# Patient Record
Sex: Female | Born: 1970 | Race: White | Hispanic: No | Marital: Married | Smoking: Current every day smoker
Health system: Southern US, Community
[De-identification: ages and names within clinical notes are randomized; demographics above are authoritative.]

---

## 2000-02-06 ENCOUNTER — Other Ambulatory Visit: Admission: RE | Admit: 2000-02-06 | Discharge: 2000-02-06 | Payer: Self-pay | Admitting: General Practice

## 2009-07-21 ENCOUNTER — Ambulatory Visit (HOSPITAL_COMMUNITY): Admission: RE | Admit: 2009-07-21 | Discharge: 2009-07-21 | Payer: Self-pay | Admitting: Obstetrics and Gynecology

## 2009-08-18 ENCOUNTER — Ambulatory Visit (HOSPITAL_COMMUNITY): Admission: RE | Admit: 2009-08-18 | Discharge: 2009-08-18 | Payer: Self-pay | Admitting: Obstetrics and Gynecology

## 2009-09-01 ENCOUNTER — Ambulatory Visit (HOSPITAL_COMMUNITY): Admission: RE | Admit: 2009-09-01 | Discharge: 2009-09-01 | Payer: Self-pay | Admitting: Obstetrics and Gynecology

## 2009-10-18 ENCOUNTER — Ambulatory Visit (HOSPITAL_COMMUNITY): Admission: RE | Admit: 2009-10-18 | Discharge: 2009-10-18 | Payer: Self-pay | Admitting: Obstetrics and Gynecology

## 2009-11-22 ENCOUNTER — Ambulatory Visit (HOSPITAL_COMMUNITY): Admission: RE | Admit: 2009-11-22 | Discharge: 2009-11-22 | Payer: Self-pay | Admitting: Obstetrics and Gynecology

## 2009-12-27 ENCOUNTER — Ambulatory Visit (HOSPITAL_COMMUNITY): Admission: RE | Admit: 2009-12-27 | Discharge: 2009-12-27 | Payer: Self-pay | Admitting: Obstetrics and Gynecology

## 2010-09-04 ENCOUNTER — Encounter: Payer: Self-pay | Admitting: Obstetrics and Gynecology

## 2011-10-10 IMAGING — US US OB NUCHAL TRANSLUCENCY 1ST GEST
1 series · 14 of 28 positions shown · non-contrast
Comparison: none

OBSTETRICAL ULTRASOUND:
 This ultrasound was performed in The [HOSPITAL], and the [facility] report will be stored to [REDACTED] PACS.  This report is also available in [HOSPITAL]?s accessANYware.

[Series 1: us ob nuchal translucency 1st gest · 14 of 31 slices shown]
[im 2/31]
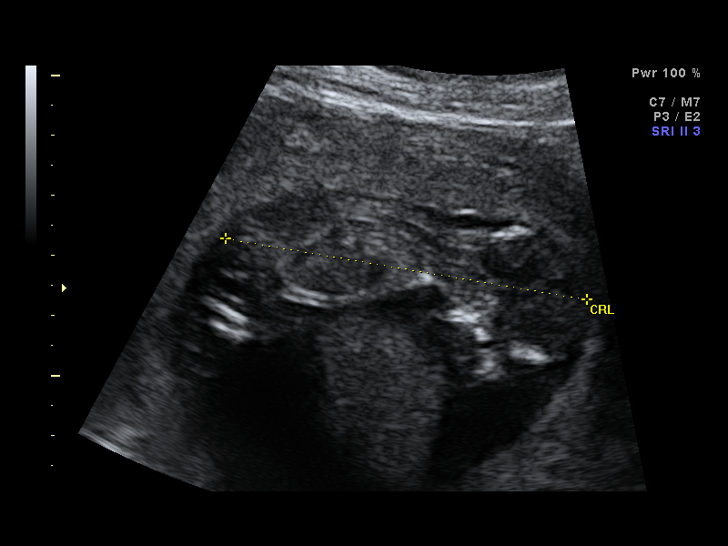
[im 4/31]
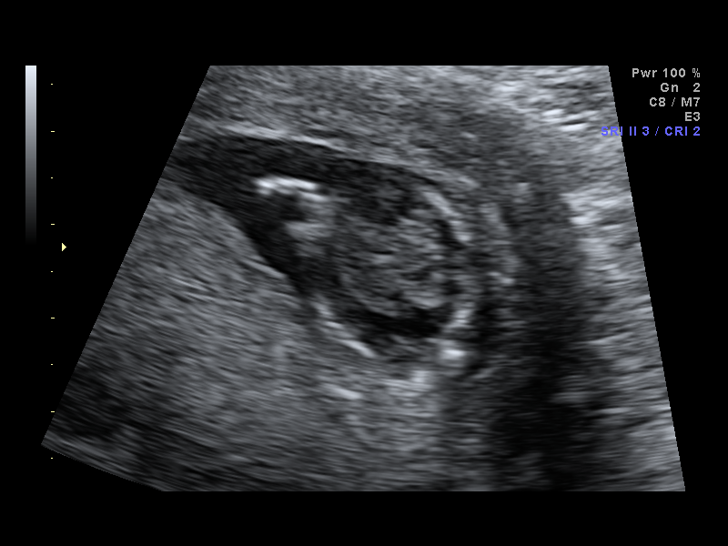
[im 6/31]
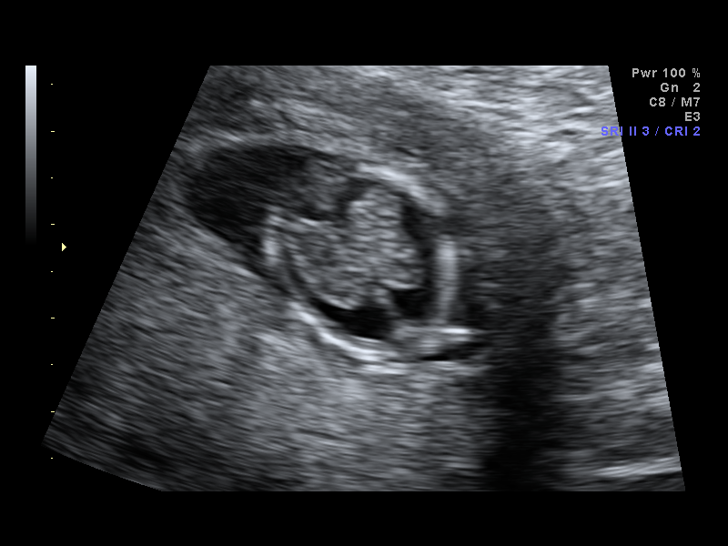
[im 8/31]
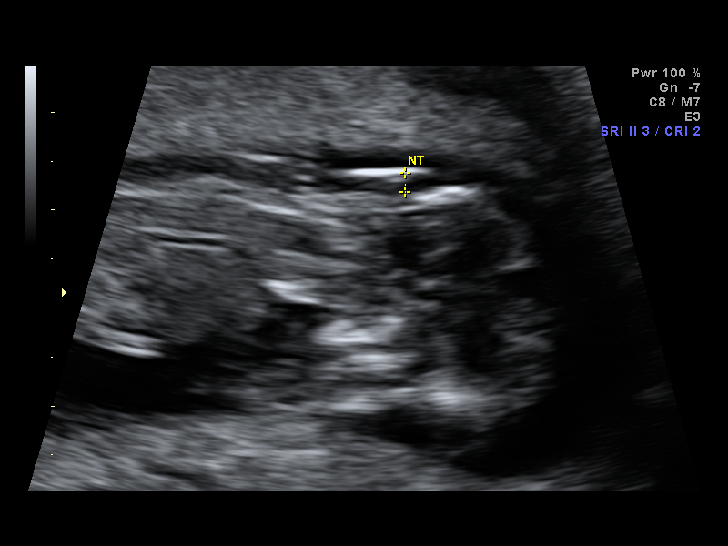
[im 11/31]
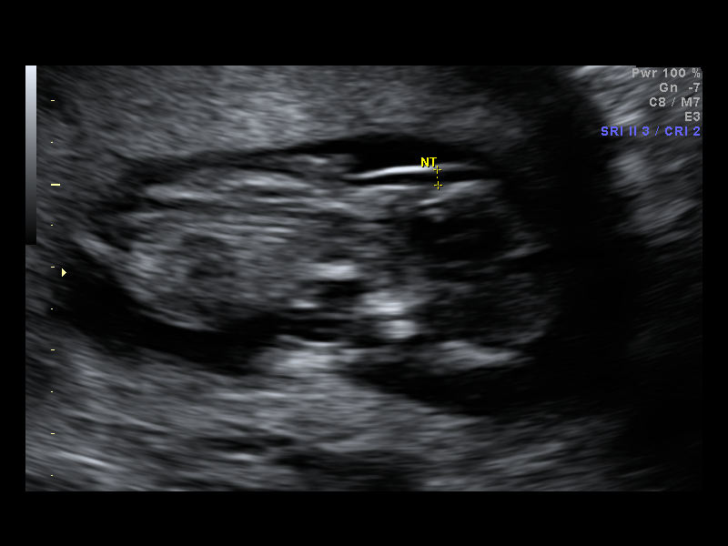
[im 13/31]
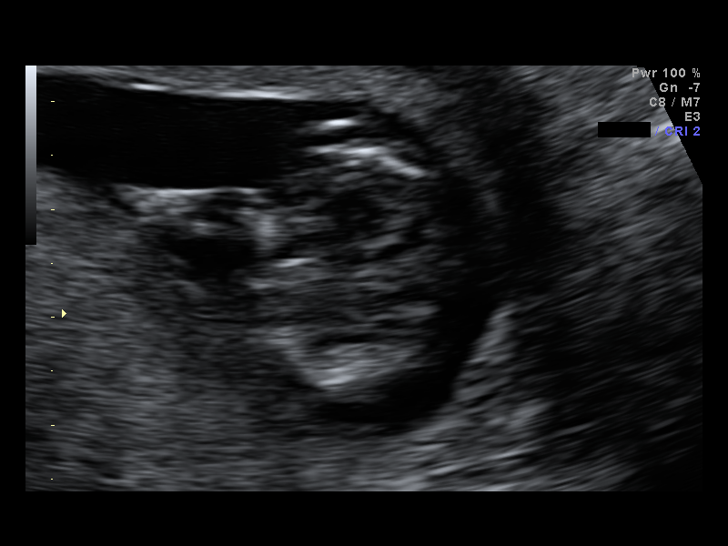
[im 15/31]
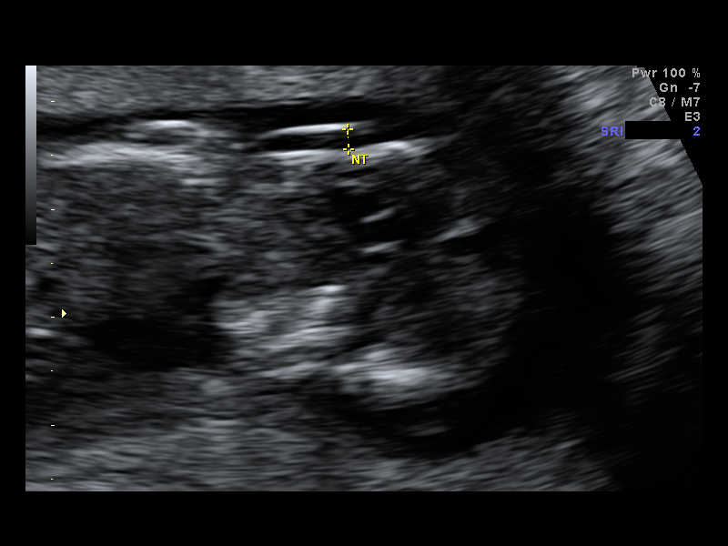
[im 17/31]
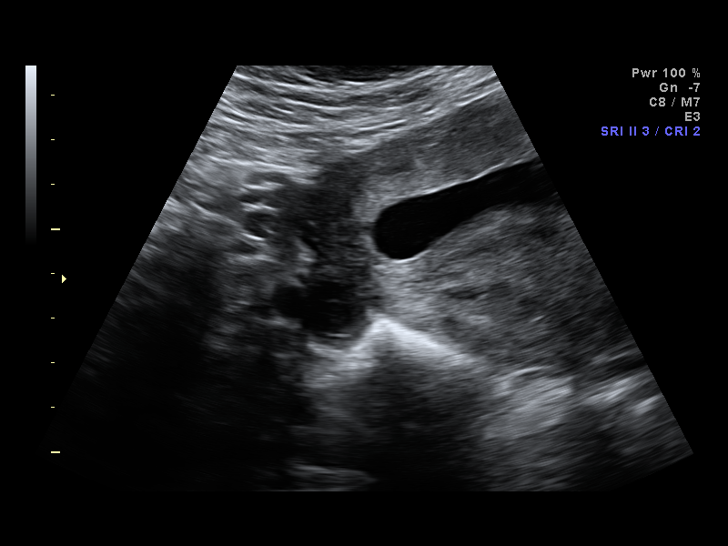
[im 19/31]
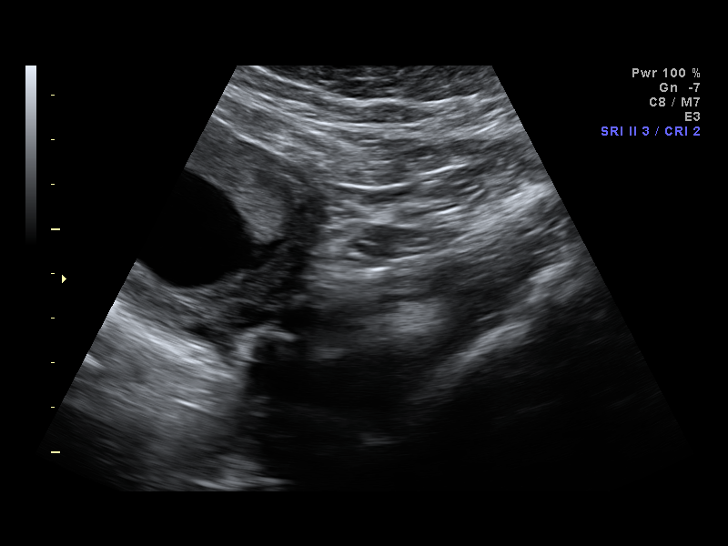
[im 22/31]
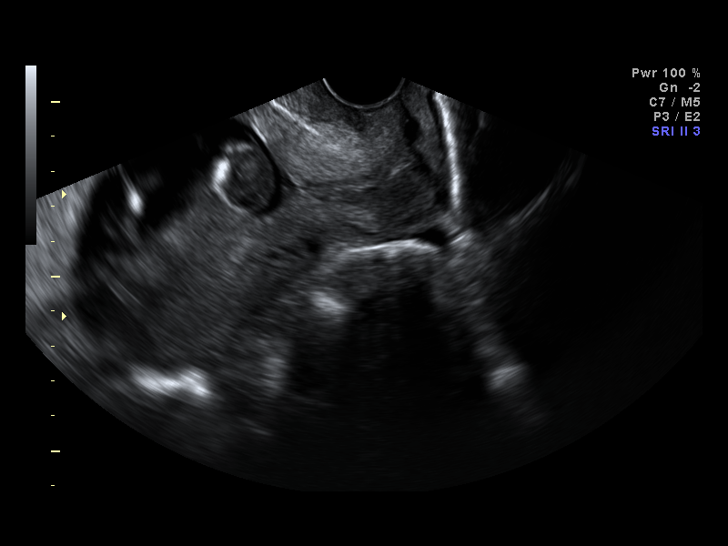
[im 24/31]
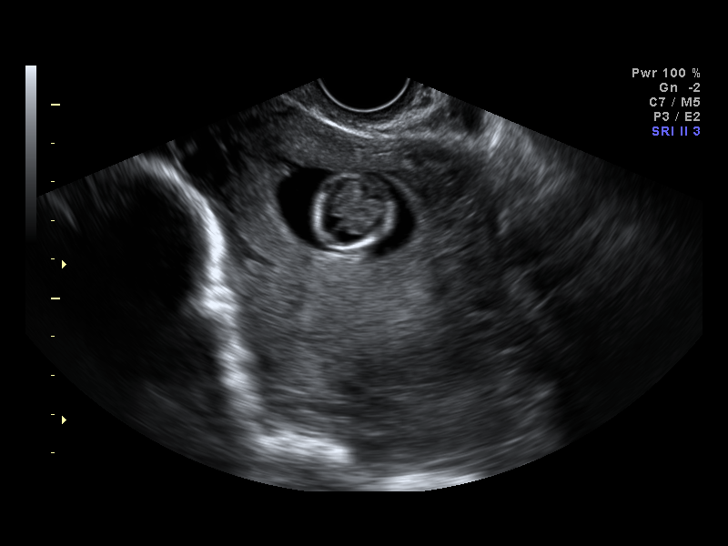
[im 26/31]
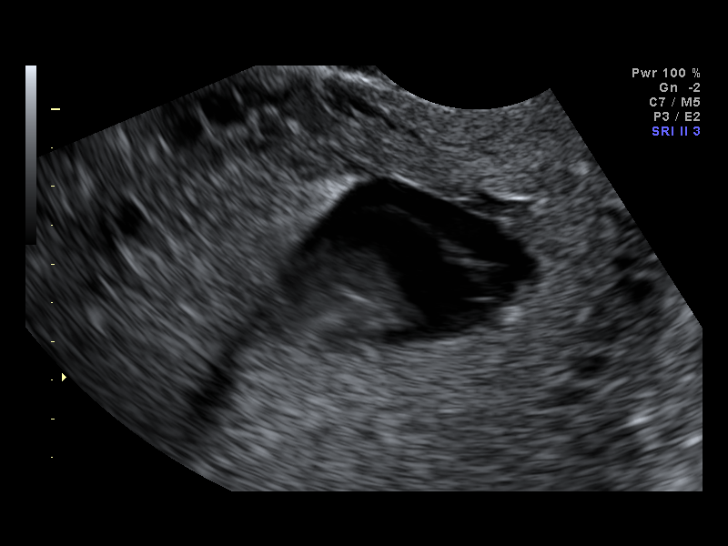
[im 28/31]
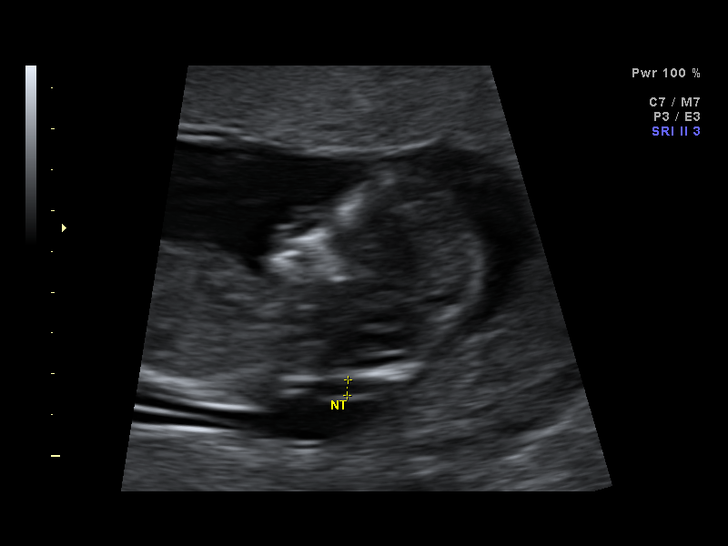
[im 31/31]
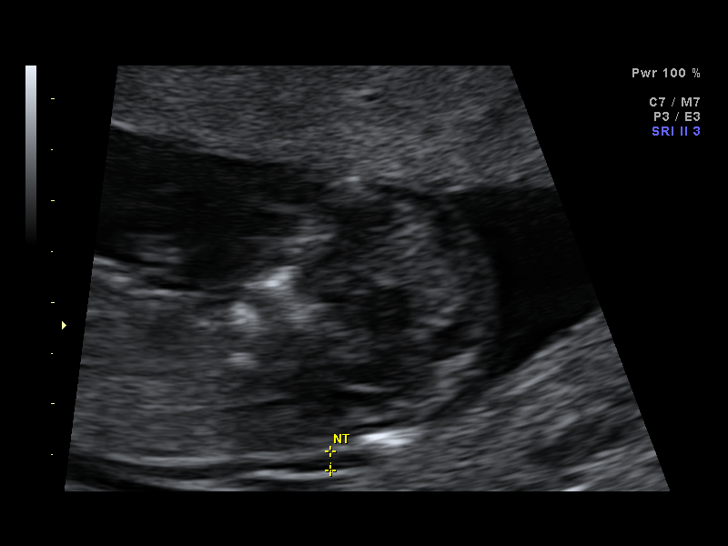

[14 of 28 positions shown; findings below may reference images not displayed]

IMPRESSION: [facility] has also been faxed to the ordering physician.

## 2012-02-11 IMAGING — US US OB FOLLOW-UP
1 series · 14 of 28 positions shown · non-contrast
Comparison: none

OBSTETRICAL ULTRASOUND:
 This ultrasound was performed in The [[facility]], and the [facility] report will be stored to [REDACTED] PACS.  This report is also available in [HOSPITAL]?s accessANYware.

[Series 1: us ob follow-up · 14 of 43 slices shown]
[im 2/43]
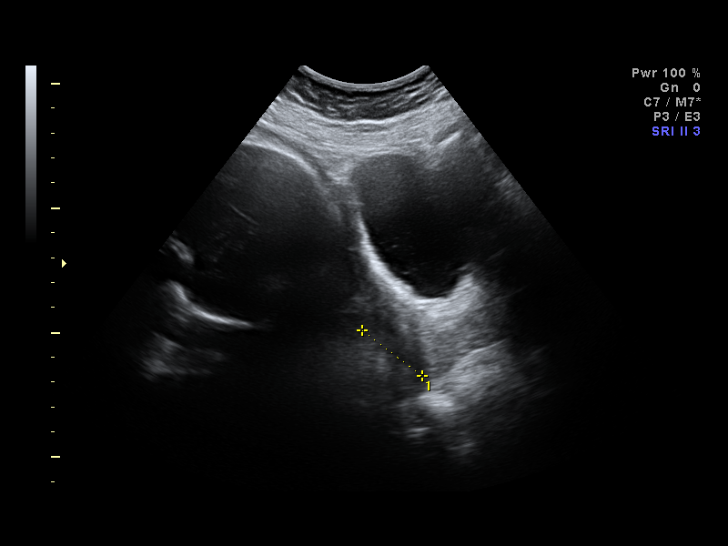
[im 5/43]
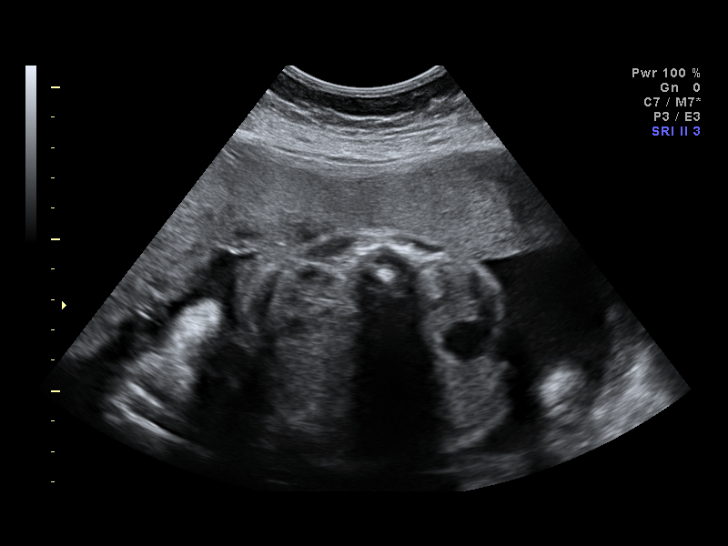
[im 8/43]
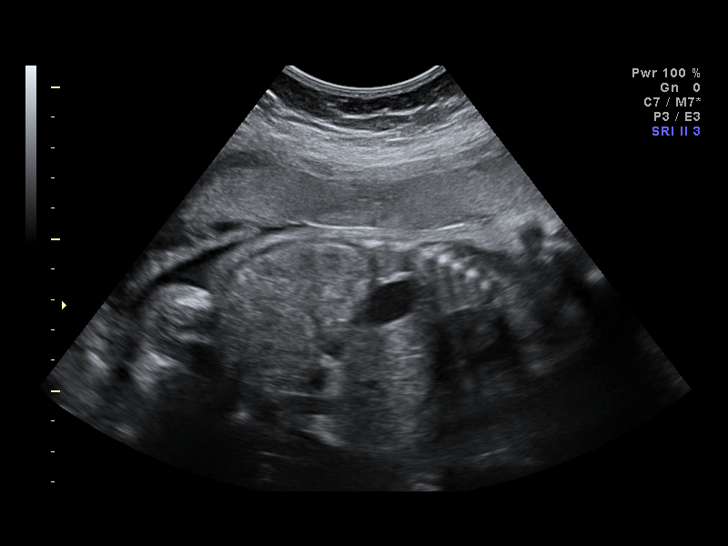
[im 11/43]
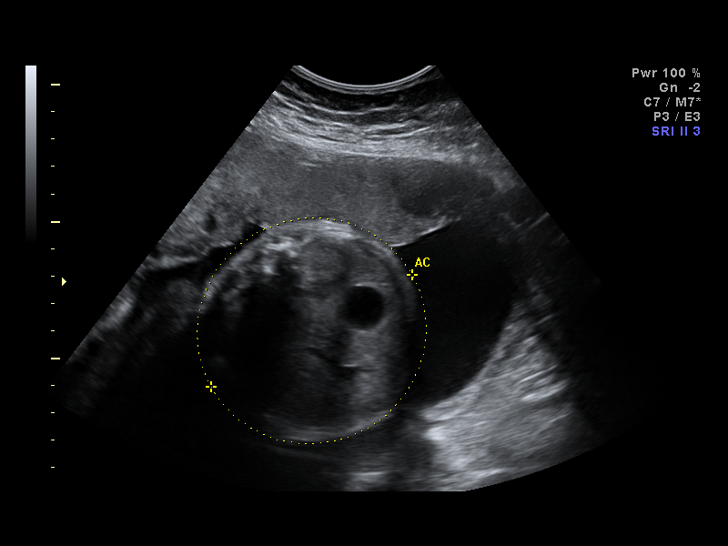
[im 15/43]
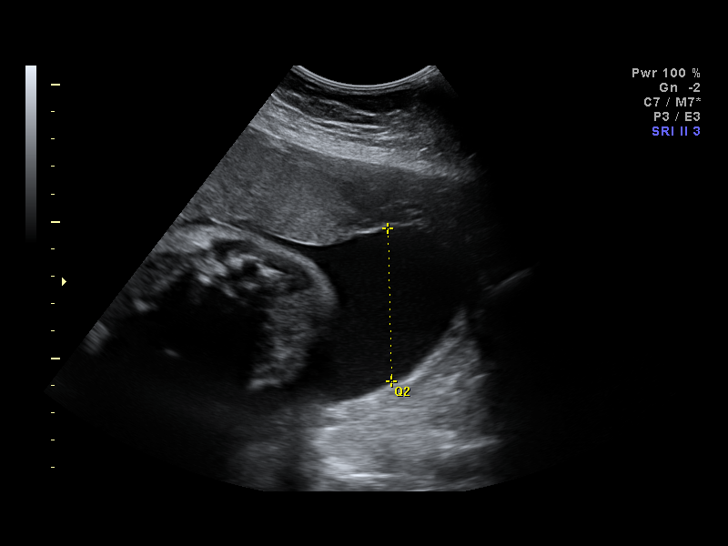
[im 18/43]
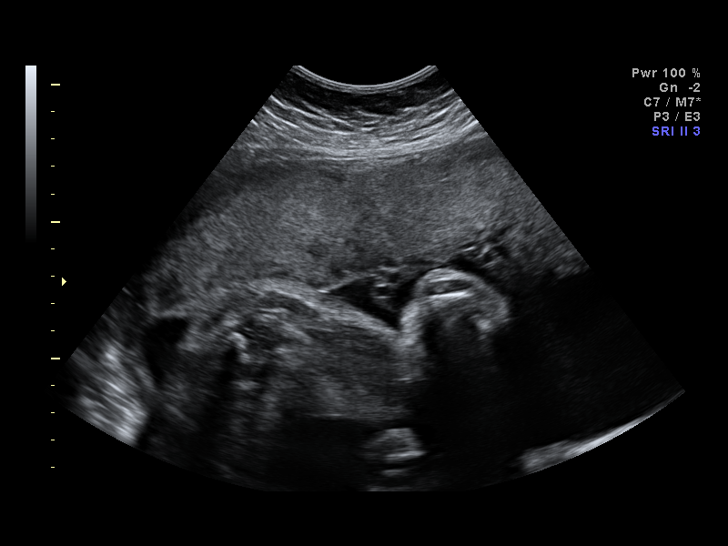
[im 21/43]
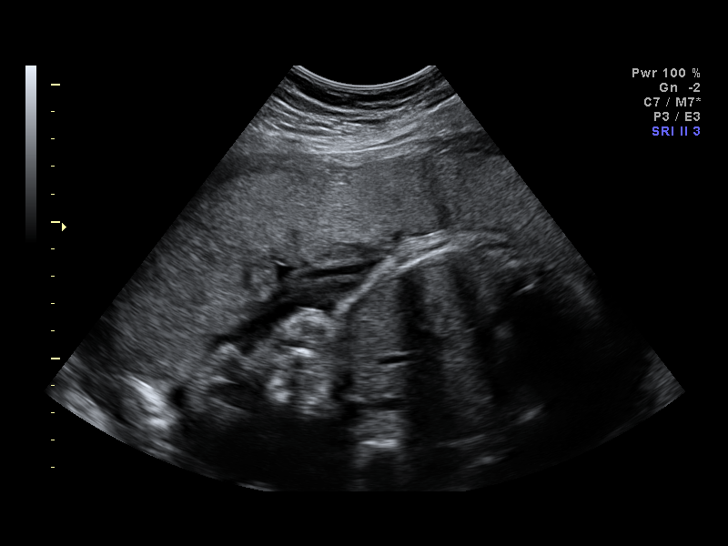
[im 24/43]
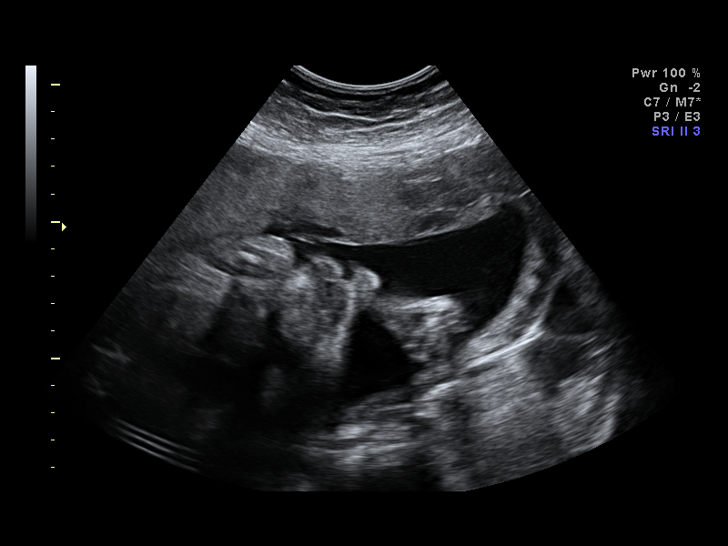
[im 27/43]
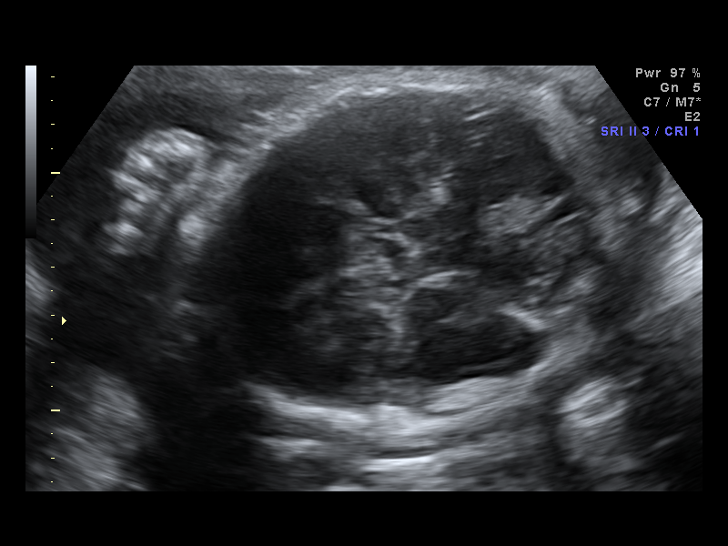
[im 30/43]
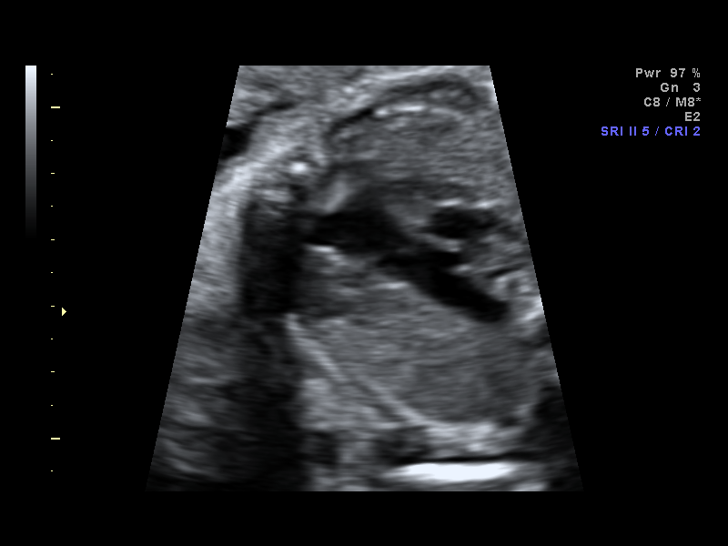
[im 33/43]
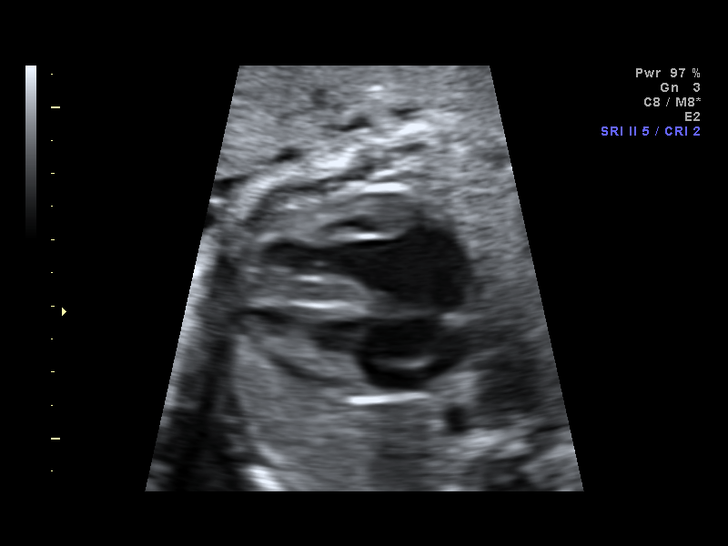
[im 36/43]
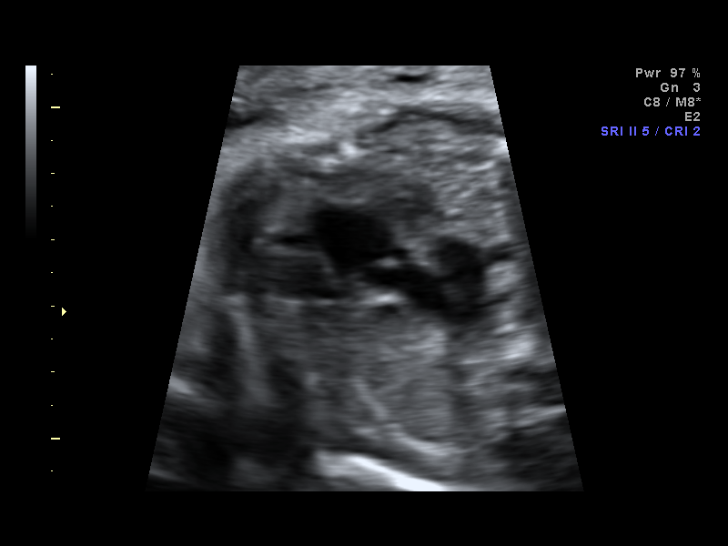
[im 39/43]
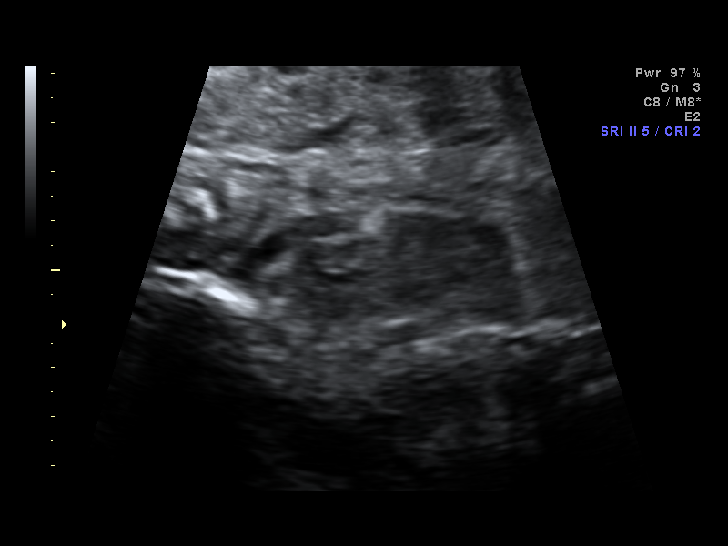
[im 43/43]
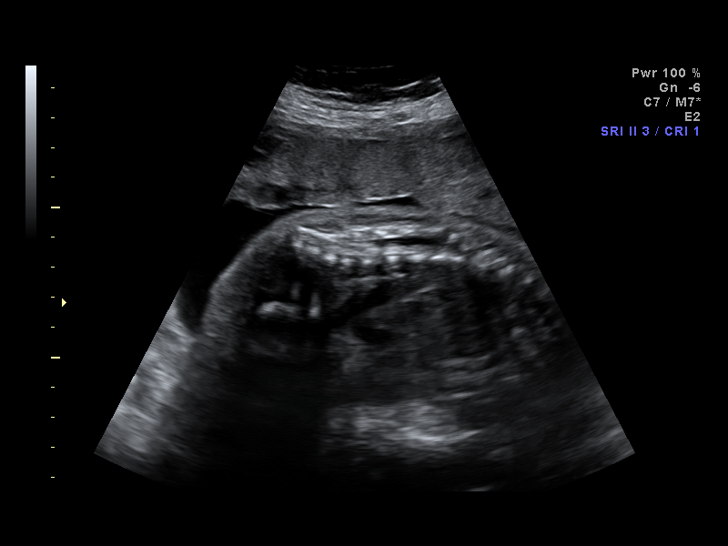

[14 of 28 positions shown; findings below may reference images not displayed]

IMPRESSION: [facility] has also been faxed to the ordering physician.

## 2014-12-11 ENCOUNTER — Encounter: Payer: Self-pay | Admitting: Podiatry

## 2014-12-22 ENCOUNTER — Ambulatory Visit (INDEPENDENT_AMBULATORY_CARE_PROVIDER_SITE_OTHER): Payer: BLUE CROSS/BLUE SHIELD | Admitting: Podiatry

## 2014-12-22 VITALS — BP 108/72 | HR 101 | Resp 15

## 2014-12-22 DIAGNOSIS — L600 Ingrowing nail: Secondary | ICD-10-CM | POA: Diagnosis not present

## 2014-12-22 DIAGNOSIS — M79674 Pain in right toe(s): Secondary | ICD-10-CM

## 2014-12-22 MED ORDER — HYDROCODONE-ACETAMINOPHEN 10-325 MG PO TABS: 1.0000 | ORAL_TABLET | Freq: Three times a day (TID) | ORAL | Status: AC | PRN

## 2014-12-22 NOTE — Progress Notes (Signed)
Subjective:     Patient ID: Megan Bishop, female   DOB: 1971/07/25, 44 y.o.   MRN: [id number]  HPI patient states I have severe pain in my right big toenail and my left big toenail is also damaged and I cannot cut it or do anything with it and it can become painful at times. Patient states that she's tried trimming soaking without relief of symptoms   Review of Systems  All other systems reviewed and are negative.      Objective:   Physical Exam  Constitutional: She is oriented to person, place, and time.  Cardiovascular: Intact distal pulses.   Musculoskeletal: Normal range of motion.  Neurological: She is oriented to person, place, and time.  Skin: Skin is warm.  Nursing note and vitals reviewed.  neurovascular status intact with muscle strength adequate range of motion of the subtalar midtarsal joint within normal limits. Patient's noted to have severe incurvation of the hallux nail right lateral border and the entire left hallux nails found to be thickened and incurvated in the corners. Both big toenails are traumatized and painful when pressed and patient has difficulty with wearing shoe gear. 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 Patient wants them removed and I did explain that this would be a permanent procedure and I reviewed there is no guarantee as far as success. Patient wants surgery understanding risk and today I infiltrated each hallux 60 mg Xylocaine Marcaine mixture remove the entire nail and applied phenol 5 applications 30 seconds followed by alcohol lavage and sterile dressing. Gave instructions on soaks

## 2014-12-22 NOTE — Progress Notes (Signed)
   Subjective:    Patient ID: Megan Bishop, female    DOB: 25-Aug-1970, 44 y.o.   MRN: [id number]  HPI Pt presents with thickened discolored nails and painful right great ingrown nail   Review of Systems  All other systems reviewed and are negative.      Objective:   Physical Exam        Assessment & Plan:

## 2014-12-22 NOTE — Patient Instructions (Signed)

## 2014-12-25 ENCOUNTER — Encounter: Payer: Self-pay | Admitting: Podiatry

## 2014-12-25 ENCOUNTER — Ambulatory Visit (INDEPENDENT_AMBULATORY_CARE_PROVIDER_SITE_OTHER): Payer: BLUE CROSS/BLUE SHIELD | Admitting: Podiatry

## 2014-12-25 VITALS — BP 98/64 | HR 102 | Resp 16

## 2014-12-25 DIAGNOSIS — L03019 Cellulitis of unspecified finger: Secondary | ICD-10-CM

## 2014-12-25 DIAGNOSIS — IMO0002 Reserved for concepts with insufficient information to code with codable children: Secondary | ICD-10-CM

## 2014-12-25 MED ORDER — CEPHALEXIN 500 MG PO CAPS: 500.0000 mg | ORAL_CAPSULE | Freq: Three times a day (TID) | ORAL | Status: AC

## 2014-12-27 NOTE — Progress Notes (Signed)
Subjective:     Patient ID: Megan Bishop, female   DOB: 1971-03-15, 44 y.o.   MRN: [id number]  HPI I was concerned about the healing of my ingrown toenails that were just fix because there's redness more on the right than the left   Review of Systems     Objective:   Physical Exam Vascular status intact muscle strength is not changed and there is noted to be localized erythema around the hallux nail bed right over left with no proximal edema erythema drainage noted    Assessment:     Mild paronychia infection right over left hallux secondary to ingrown toenail removal of the entire nail    Plan:     Advised on changing soaks and continuing bandage usage during the day and allowing them to air dry at night. Prescribe cephalexin 500 mg 3 times a day and reappoint if symptoms were to continue to give her problems

## 2015-01-12 NOTE — Progress Notes (Signed)
This encounter was created in error - please disregard.  This encounter was created in error - please disregard.
